# Patient Record
Sex: Male | Born: 1970 | Race: Black or African American | Hispanic: No
Health system: Southern US, Community
[De-identification: ages and names within clinical notes are randomized; demographics above are authoritative.]

---

## 2005-10-06 ENCOUNTER — Encounter (INDEPENDENT_AMBULATORY_CARE_PROVIDER_SITE_OTHER): Payer: Self-pay | Admitting: *Deleted

## 2005-10-06 ENCOUNTER — Other Ambulatory Visit: Admission: RE | Admit: 2005-10-06 | Discharge: 2005-10-06 | Payer: Self-pay | Admitting: Urology

## 2009-02-20 ENCOUNTER — Ambulatory Visit (HOSPITAL_COMMUNITY): Admission: RE | Admit: 2009-02-20 | Discharge: 2009-02-20 | Payer: Self-pay | Admitting: Family Medicine

## 2010-03-20 ENCOUNTER — Emergency Department (HOSPITAL_COMMUNITY): Payer: BC Managed Care – PPO

## 2010-03-20 ENCOUNTER — Observation Stay (HOSPITAL_COMMUNITY)
Admission: EM | Admit: 2010-03-20 | Discharge: 2010-03-21 | Disposition: A | Discharge status: Home / Self Care | Payer: BC Managed Care – PPO | Attending: Internal Medicine | Admitting: Internal Medicine

## 2010-03-20 ENCOUNTER — Other Ambulatory Visit (HOSPITAL_COMMUNITY): Payer: Self-pay | Admitting: Internal Medicine

## 2010-03-20 ENCOUNTER — Ambulatory Visit (HOSPITAL_COMMUNITY)
Admission: RE | Admit: 2010-03-20 | Discharge: 2010-03-20 | Disposition: A | Discharge status: Home / Self Care | Payer: BC Managed Care – PPO | Source: Ambulatory Visit | Attending: Internal Medicine | Admitting: Internal Medicine

## 2010-03-20 DIAGNOSIS — R05 Cough: Secondary | ICD-10-CM | POA: Insufficient documentation

## 2010-03-20 DIAGNOSIS — R630 Anorexia: Secondary | ICD-10-CM | POA: Insufficient documentation

## 2010-03-20 DIAGNOSIS — E860 Dehydration: Secondary | ICD-10-CM | POA: Insufficient documentation

## 2010-03-20 DIAGNOSIS — E119 Type 2 diabetes mellitus without complications: Secondary | ICD-10-CM | POA: Insufficient documentation

## 2010-03-20 DIAGNOSIS — R059 Cough, unspecified: Secondary | ICD-10-CM | POA: Insufficient documentation

## 2010-03-20 DIAGNOSIS — R509 Fever, unspecified: Secondary | ICD-10-CM | POA: Insufficient documentation

## 2010-03-20 DIAGNOSIS — R7402 Elevation of levels of lactic acid dehydrogenase (LDH): Secondary | ICD-10-CM | POA: Insufficient documentation

## 2010-03-20 DIAGNOSIS — R7401 Elevation of levels of liver transaminase levels: Secondary | ICD-10-CM | POA: Insufficient documentation

## 2010-03-20 DIAGNOSIS — N289 Disorder of kidney and ureter, unspecified: Secondary | ICD-10-CM | POA: Insufficient documentation

## 2010-03-20 DIAGNOSIS — R21 Rash and other nonspecific skin eruption: Secondary | ICD-10-CM | POA: Insufficient documentation

## 2010-03-20 DIAGNOSIS — R5381 Other malaise: Secondary | ICD-10-CM | POA: Insufficient documentation

## 2010-03-20 LAB — DIFFERENTIAL
Eosinophils Absolute: 0 10*3/uL (ref 0.0–0.7)
Lymphocytes Relative: 7 % — ABNORMAL LOW (ref 12–46)
Monocytes Absolute: 0.8 10*3/uL (ref 0.1–1.0)

## 2010-03-20 LAB — LACTIC ACID, PLASMA: Lactic Acid, Venous: 2.2 mmol/L (ref 0.5–2.2)

## 2010-03-20 LAB — URINALYSIS, ROUTINE W REFLEX MICROSCOPIC
Ketones, ur: 40 mg/dL — AB
Protein, ur: 100 mg/dL — AB
Specific Gravity, Urine: 1.025 (ref 1.005–1.030)
Urobilinogen, UA: >8 mg/dL — ABNORMAL HIGH (ref 0.0–1.0)

## 2010-03-20 LAB — COMPREHENSIVE METABOLIC PANEL
AST: 49 U/L — ABNORMAL HIGH (ref 0–37)
Albumin: 3.5 g/dL (ref 3.5–5.2)
Chloride: 96 mEq/L (ref 96–112)
GFR calc Af Amer: >60 mL/min (ref >60)
Total Bilirubin: 1.8 mg/dL — ABNORMAL HIGH (ref 0.3–1.2)
Total Protein: 7.4 g/dL (ref 6.0–8.3)

## 2010-03-20 LAB — CBC
HCT: 40.3 % (ref 39.0–52.0)
MCH: 29.5 pg (ref 26.0–34.0)
MCHC: 34 g/dL (ref 30.0–36.0)
MCV: 86.7 fL (ref 78.0–100.0)
Platelets: 302 10*3/uL (ref 150–400)
RBC: 4.65 MIL/uL (ref 4.22–5.81)
RDW: 12.5 % (ref 11.5–15.5)

## 2010-03-20 LAB — URINE MICROSCOPIC-ADD ON

## 2010-03-20 LAB — RAPID URINE DRUG SCREEN, HOSP PERFORMED
Amphetamines: NOT DETECTED
Barbiturates: NOT DETECTED
Benzodiazepines: NOT DETECTED
Tetrahydrocannabinol: NOT DETECTED

## 2010-03-20 LAB — TROPONIN I: Troponin I: 0.03 ng/mL (ref 0.00–0.06)

## 2010-03-20 LAB — GLUCOSE, CAPILLARY: Glucose-Capillary: 116 mg/dL — ABNORMAL HIGH (ref 70–99)

## 2010-03-20 LAB — CK TOTAL AND CKMB (NOT AT ARMC): Relative Index: INVALID (ref 0.0–2.5)

## 2010-03-20 LAB — PROCALCITONIN: Procalcitonin: 0.2 ng/mL

## 2010-03-21 LAB — DIFFERENTIAL
Basophils Absolute: 0.1 K/uL (ref 0.0–0.1)
Basophils Relative: 1 % (ref 0–1)
Eosinophils Absolute: 0.2 K/uL (ref 0.0–0.7)
Eosinophils Relative: 2 % (ref 0–5)
Lymphocytes Relative: 9 % — ABNORMAL LOW (ref 12–46)
Lymphs Abs: 0.9 K/uL (ref 0.7–4.0)
Monocytes Absolute: 0.9 K/uL (ref 0.1–1.0)
Monocytes Relative: 9 % (ref 3–12)
Neutro Abs: 7.7 K/uL (ref 1.7–7.7)
Neutrophils Relative %: 79 % — ABNORMAL HIGH (ref 43–77)

## 2010-03-21 LAB — HEMOGLOBIN A1C
Hgb A1c MFr Bld: 5.3 %
Mean Plasma Glucose: 105 mg/dL

## 2010-03-21 LAB — CBC
HCT: 37.1 % — ABNORMAL LOW (ref 39.0–52.0)
Hemoglobin: 12.5 g/dL — ABNORMAL LOW (ref 13.0–17.0)
MCH: 29.3 pg (ref 26.0–34.0)
MCHC: 33.7 g/dL (ref 30.0–36.0)
MCV: 87.1 fL (ref 78.0–100.0)
Platelets: 289 K/uL (ref 150–400)
RBC: 4.26 MIL/uL (ref 4.22–5.81)
RDW: 12.6 % (ref 11.5–15.5)
WBC: 9.7 K/uL (ref 4.0–10.5)

## 2010-03-21 LAB — COMPREHENSIVE METABOLIC PANEL WITH GFR
ALT: 52 U/L (ref 0–53)
AST: 31 U/L (ref 0–37)
Albumin: 2.9 g/dL — ABNORMAL LOW (ref 3.5–5.2)
Alkaline Phosphatase: 85 U/L (ref 39–117)
BUN: 12 mg/dL (ref 6–23)
CO2: 26 meq/L (ref 19–32)
Calcium: 8.5 mg/dL (ref 8.4–10.5)
Chloride: 100 meq/L (ref 96–112)
Creatinine, Ser: 1.2 mg/dL (ref 0.4–1.5)
GFR calc non Af Amer: >60 mL/min
Glucose, Bld: 86 mg/dL (ref 70–99)
Potassium: 4 meq/L (ref 3.5–5.1)
Sodium: 136 meq/L (ref 135–145)
Total Bilirubin: 1.4 mg/dL — ABNORMAL HIGH (ref 0.3–1.2)
Total Protein: 6.2 g/dL (ref 6.0–8.3)

## 2010-03-21 LAB — HEPATITIS PANEL, ACUTE
HCV Ab: NEGATIVE
Hepatitis B Surface Ag: NEGATIVE

## 2010-03-21 LAB — HIV ANTIBODY (ROUTINE TESTING W REFLEX): HIV: NONREACTIVE

## 2010-03-21 LAB — GLUCOSE, CAPILLARY
Glucose-Capillary: 101 mg/dL — ABNORMAL HIGH (ref 70–99)
Glucose-Capillary: 132 mg/dL — ABNORMAL HIGH (ref 70–99)

## 2010-03-21 LAB — URINE CULTURE

## 2010-03-21 LAB — TSH: TSH: 1.546 u[IU]/mL (ref 0.350–4.500)

## 2010-03-25 LAB — CULTURE, BLOOD (ROUTINE X 2)

## 2010-03-28 NOTE — Discharge Summary (Signed)
NAMEOSCEOLA, DEPAZ          ACCOUNT NO.:  [id number]  MEDICAL RECORD NO.:  [id number]           PATIENT TYPE:  I  LOCATION:  A304                          FACILITY:  [facility]  PHYSICIAN:  Brogan England L. Lendell Caprice, MDDATE OF BIRTH:  [birth year]  DATE OF ADMISSION:  03/20/2010 DATE OF DISCHARGE:  03/01/2012LH                              DISCHARGE SUMMARY   DISCHARGE DIAGNOSES: 1. Fever:  Suspect viral illness but cannot rule out urinary tract     infection. 2. Recently diagnosed diabetes, euglycemic off medications currently,     and hemoglobin A1c normal. 3. Slightly elevated liver function tests, improved. 4. Dehydration. 5. Acute renal insufficiency secondary to above.  DISCHARGE MEDICATIONS: 1. Ciprofloxacin 500 mg p.o. b.i.d. to complete a 7-day course. 2. Tylenol 650 mg p.o. q.4 h. p.r.n. pain or fever. 3. Ibuprofen 400 mg p.o. q.4 h. p.r.n. pain or fever. 4. Stop metformin but resume if fasting blood glucose above 150     consistently.  CONDITION:  Stable.  ACTIVITY:  No heavy exertion or lifting until symptoms are resolved.  He may return to work next week.  Follow up with Dr. Sherwood Gambler in 2 weeks.  Please monitor blood glucose and need for continued diabetic diet and metformin.  DIET:  Should be diabetic with plenty of fluids.  CONSULTATIONS:  None.  PROCEDURES:  None.  LABORATORY DATA:  CBC on admission significant for white blood cell count of 12,500.  At discharge, his white count was 9700.  The rest of his CBC was unremarkable.  Complete metabolic panel on admission significant for a normal glucose, BUN was 15, creatinine 1.55.  Total bilirubin 1.8, normal alkaline phosphatase, SGOT 49, SGPT 69.  At discharge, his creatinine is 1.2.  His liver function tests were normal except for slightly elevated total bilirubin of 1.4.  TSH normal. Hepatitis B surface antigen negative, hepatitis C antibody negative. HIV screen nonreactive.  TSH normal.  CPK normal.  Urine  drug screen negative.  Urinalysis showed 100 glucose, moderate bilirubin, 40 ketones, trace protein, greater than 8 urobilinogen, positive nitrite, negative leukocyte esterase, 7-10 white cells, 0-2 red cells, few bacteria.  Gram stain negative.  Mono screen negative.  Urine culture pending.  Blood cultures to date are negative.  DIAGNOSTICS:  Chest x-ray negative.  HISTORY AND HOSPITAL COURSE:  Please see H and P for details.  Mr. Jacob Mcneil is a 40 year old black male who has had fevers and chills as well as generalized malaise, weakness, and anorexia for the past week or so.  He went to the office, got some blood work done about a week ago and was called in a prescription for metformin.  He has no previous history of diabetes.  He came back to his primary care physician's office and reportedly had a fever of 103.7.  He reportedly has had fevers up to 105 at home.  He developed a rash on his legs in the emergency room which was nonpruritic, it was macular and erythematous. He had no rash anywhere else.  He had no recent sick contacts, no travel.  He had no other symptoms.  He had no dysuria, specifically no flank  pain or suprapubic pain.  In the emergency room after having had nonsteroidal anti-inflammatories, he was afebrile.  He was a bit dehydrated and weak and so he was observed overnight and given IV fluids.  Despite the paucity of UTI symptoms, he was started empirically on Cipro, but I suspect this may all be viral.  His liver function tests were slightly elevated.  He had no abdominal pain or tenderness, and the hepatitis screen to date is negative.  His liver function tests are improving, and I suspect this was just a result of the acute illness. He does not check his sugars at home.  He does not have a glucometer. His blood sugars here off metformin have been around 100.  His hemoglobin A1c was only 5.7.  I suspect his hyperglycemia in the office may have been due to stress  response from acute illness.  I have asked him to stop his metformin but continue with diabetic diet and this will have to be managed as an outpatient.  I also asked the nurses to teach him glucometer use and given him a prescription for glucometer to check his sugars daily.  He is to bring the values with him to the followup appointment.  His rash improved.  He was feeling much stronger.  He was tolerating a diet, and he is safe to go home and can have Cipro and ibuprofen.  His blood pressure, and vital signs have, otherwise, been stable.     Kamari Buch L. Lendell Caprice, MD     CLS/MEDQ  D:  03/21/2010  T:  03/22/2010  Job:  [id number]  cc:   Madelin Rear. Sherwood Gambler, MD Fax: [phone]  Electronically Signed by Crista Curb MD on 03/27/2010 07:48:18 PM

## 2010-03-28 NOTE — H&P (Addendum)
NAMEHELMUTH, RECUPERO          ACCOUNT NO.:  [id number]  MEDICAL RECORD NO.:  [id number]  LOCATION:  RDC                           FACILITY:  [facility]  PHYSICIAN:  Sharlon Pfohl L. Lendell Caprice, MDDATE OF BIRTH:  12-05-1970  DATE OF ADMISSION:  03/20/2010 DATE OF DISCHARGE:  02/29/2012LH                             HISTORY & PHYSICAL   CHIEF COMPLAINT:  Fever.  HISTORY OF PRESENT ILLNESS:  Mr. Jacob Mcneil is a 40 year old black male who has had fevers and chills for the past week.  He apparently went to the office and some blood work was drawn.  He was called back and told that he is diabetic and called in a prescription for metformin.  He has had periodic fevers, chills, malaise.  He stayed in bed all day yesterday.  He has maybe a mild amount of frontal sinus pressure and pain.  He has no rhinorrhea, no postnasal drip, no sore throat.  He developed nonpruritic rash on his legs in the emergency room today.  He has no dysuria.  His appetite has been poor.  He has had no nausea, vomiting, or diarrhea.  He has had no recent travel or sick contacts. He reportedly had a temperature of 105 at home.  His temperature was reportedly 103.7 in the office, and he took Advil.  He has been afebrile in the emergency room.  He was found to have some pyuria and mildly increased LFTs.  He has had a new sexual contact, but has no penile drainage or ulcerations.  He has no history of liver problems.  He drinks about once a week on the weekends, and denies drinking too excess.  He has no history of IV drug abuse or blood transfusions.  He has no other symptoms.  He was given Zosyn in the emergency room, but his ex-wife reports that his rash was present prior to the antibiotics.  PAST MEDICAL HISTORY:  As above.  MEDICATIONS:  Metformin, which is new 500 mg twice a day.  No known drug allergies.  SOCIAL HISTORY:  The patient drinks occasionally.  He does not smoke or use drugs.  He works at [employer].  FAMILY HISTORY:  Negative for cancer.  His mother is here and is healthy.  PAST SURGICAL HISTORY:  None.  Systems reviewed and as above otherwise negative.  PHYSICAL EXAMINATION:  VITAL SIGNS:  His temperature is 98.9; blood pressure 115/69; heart rate initially 111, currently 87; respiratory rate 17; oxygen saturation 97% on room air. GENERAL:  The patient appears nontoxic.  He is comfortable.  He has received 1 mg of Ativan and is somewhat slow to respond. HEENT:  No sinus tenderness.  Nares are patent without drainage. Oropharynx has moist mucous membranes.  No tonsillar exudate or erythema. NECK:  Supple.  No lymphadenopathy. LUNGS:  Clear to auscultation bilaterally without wheezes, rhonchi, or rales. CARDIOVASCULAR:  Regular rate and rhythm without murmurs, gallops, or rubs. ABDOMEN:  Normal bowel sounds, soft, nontender, nondistended. GU AND RECTAL:  Deferred. EXTREMITIES:  No clubbing, cyanosis, or edema. SKIN:  He has erythematous macular marks on his legs.  There is nothing on the soles of his feet or his palms or trunk or anywhere  else. NEUROLOGIC:  The patient is alert, oriented x3.  Cranial nerves and sensorimotor exam are intact. PSYCHIATRIC:  Flat affect.  His ex-wife does most of the question answering.  LABORATORY DATA:  CBC significant for white blood cell count of 12,500, 87% neutrophils.  Sodium is 134, potassium 4.3, chloride 96, bicarbonate 27, glucose 105, BUN 15, creatinine 1.55, total bilirubin 1.8, alkaline phosphatase 109, SGOT 49, SGPT 69.  Urinalysis shows a specific gravity of 1.25, 100 glucose, pH 6, moderate bilirubin, 40 ketones, trace blood, 100 protein, greater than 8 urobilinogen, nitrite positive, leukocyte esterase negative, 7-10 white cells, 0-2 red cells, few bacteria.  Chest x-ray shows nothing acute.  ASSESSMENT AND PLAN: 1. Febrile illness with rash and pyuria:  The patient does not appear     toxic.  He is agreeable to  staying overnight for monitoring.  He     has no dysuria, but I will go ahead and treat with ciprofloxacin     for urinary tract infection.  I will also check a total CPK, TSH,     HIV, hepatitis panel due to the increased LFTs.  With the rash and     the high fevers without much in the way of other symptoms other     than malaise, this may just be a viral illness, and he may be able     to go home tomorrow if he is stable.  He has not been eating much,     and his creatinine is slightly elevated.  I will give him IV fluids     overnight and supportive care. 2. Mildly increased LFTs without any abdominal pain or tenderness.     See above.  I will repeat his LFTs tomorrow. 3. His ex-wife is requesting Ativan and a sleep aid as needed.  He     reports that he is slightly anxious.  I will get a urine drug     screen. 4. Reported recently diagnosed diabetes with euglycemia here.  I     wonder whether his hyperglycemia as an outpatient may have been     related to his acute illness.  I will get records from the office     and hold his metformin and check blood glucose for now.  Also check     a hemoglobin A1c. 5. Renal insufficiency may be somewhat related to his dehydration.     Steve Youngberg L. Lendell Caprice, MD     CLS/MEDQ  D:  03/21/2010  T:  03/22/2010  Job:  [id number]  Electronically Signed by Crista Curb MD on 07/02/2010 11:12:25 AM

## 2012-04-22 IMAGING — CR DG CHEST 1V PORT
1 series · 1 of 1 positions shown · non-contrast
Comparison: Portable exam 6426 hours compared to 03/20/2010 at 2244
hours.

CLINICAL DATA: Fever, chills, dysuria, recent diagnosis of diabetes

PORTABLE CHEST - 1 VIEW

[view not recorded]
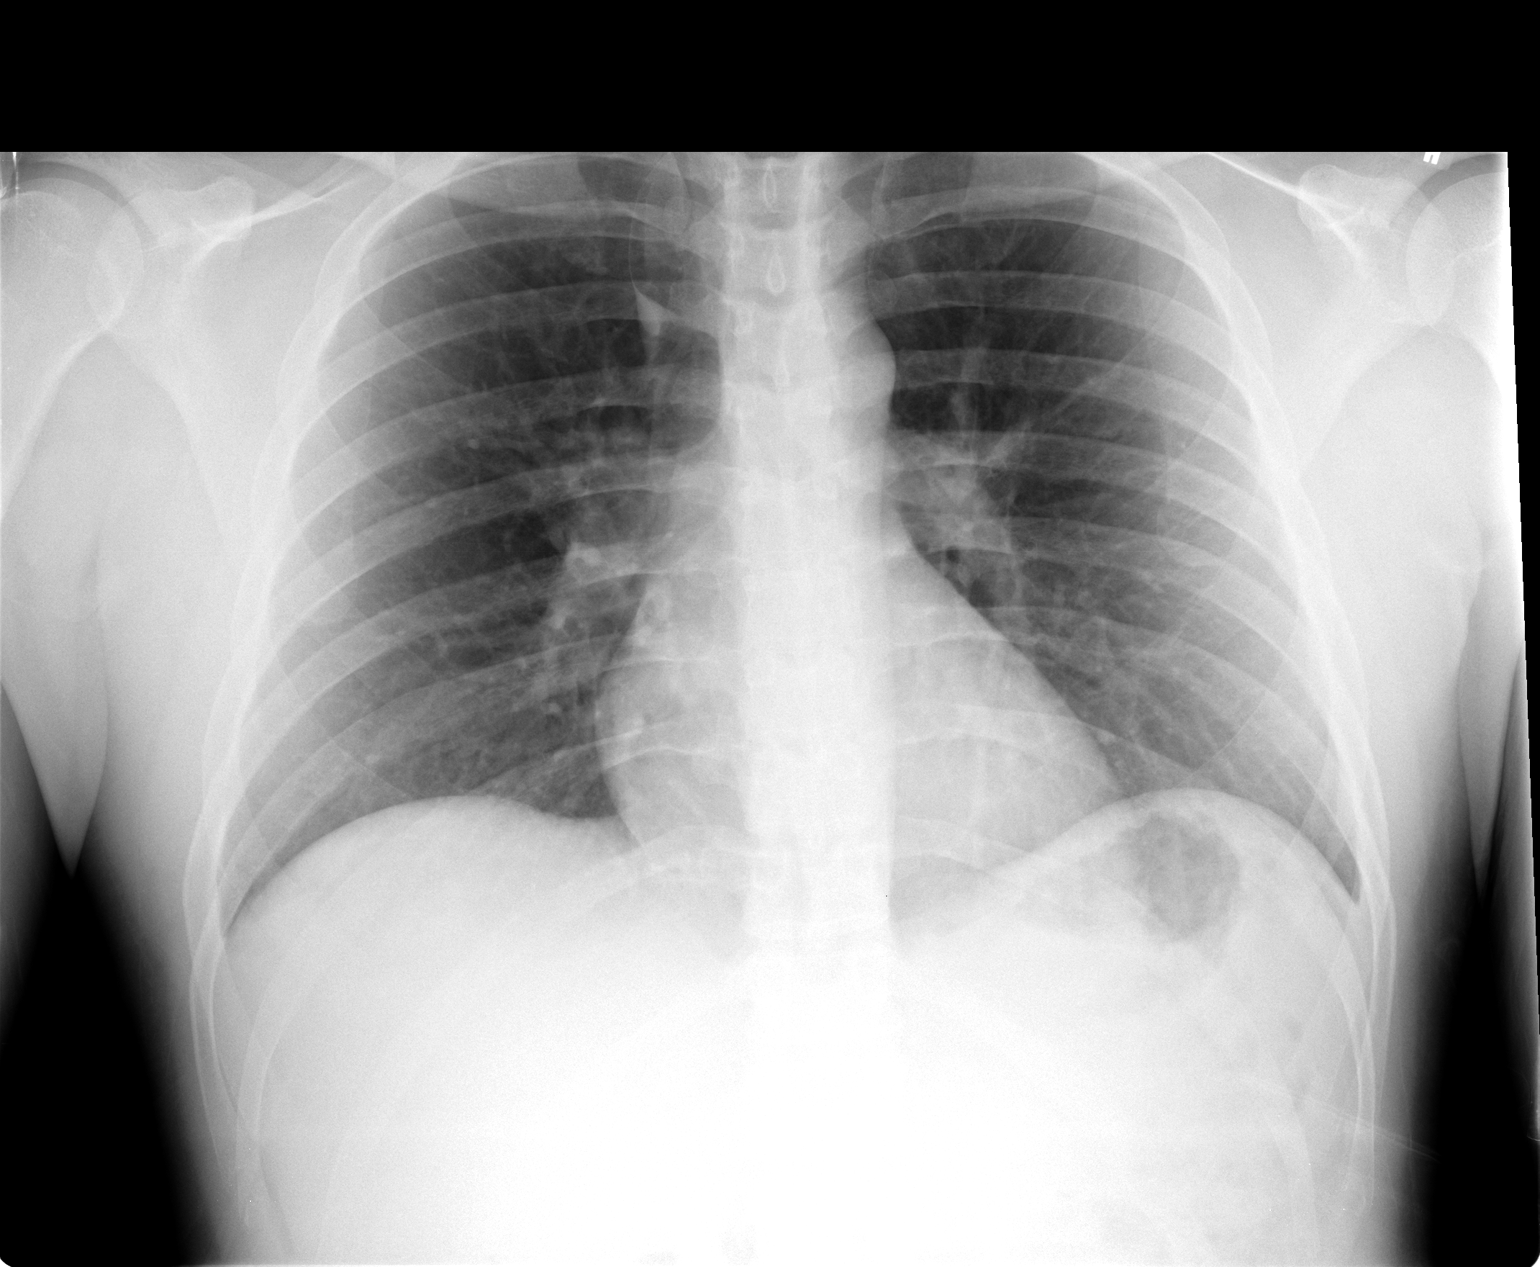

[1 of 1 positions shown; findings below may reference images not displayed]

FINDINGS: Azygos fissure identified.
Normal heart size, mediastinal contours, and pulmonary vascularity.
Lungs clear.
Bones unremarkable.
No pneumothorax.
IMPRESSION: No acute abnormalities.

## 2016-10-01 DIAGNOSIS — Z6826 Body mass index (BMI) 26.0-26.9, adult: Secondary | ICD-10-CM | POA: Diagnosis not present

## 2016-10-01 DIAGNOSIS — Z0000 Encounter for general adult medical examination without abnormal findings: Secondary | ICD-10-CM | POA: Diagnosis not present

## 2016-10-01 DIAGNOSIS — Z1389 Encounter for screening for other disorder: Secondary | ICD-10-CM | POA: Diagnosis not present

## 2016-10-01 DIAGNOSIS — E663 Overweight: Secondary | ICD-10-CM | POA: Diagnosis not present

## 2017-10-13 DIAGNOSIS — R000 Tachycardia, unspecified: Secondary | ICD-10-CM | POA: Diagnosis not present

## 2017-10-13 DIAGNOSIS — Z6828 Body mass index (BMI) 28.0-28.9, adult: Secondary | ICD-10-CM | POA: Diagnosis not present

## 2017-10-13 DIAGNOSIS — Z0000 Encounter for general adult medical examination without abnormal findings: Secondary | ICD-10-CM | POA: Diagnosis not present

## 2017-10-13 DIAGNOSIS — E663 Overweight: Secondary | ICD-10-CM | POA: Diagnosis not present

## 2017-10-13 DIAGNOSIS — R7309 Other abnormal glucose: Secondary | ICD-10-CM | POA: Diagnosis not present

## 2017-10-13 DIAGNOSIS — Z1389 Encounter for screening for other disorder: Secondary | ICD-10-CM | POA: Diagnosis not present

## 2017-10-13 DIAGNOSIS — Z0001 Encounter for general adult medical examination with abnormal findings: Secondary | ICD-10-CM | POA: Diagnosis not present

## 2017-10-28 ENCOUNTER — Encounter: Payer: Self-pay | Admitting: Cardiology

## 2020-10-17 DIAGNOSIS — Z0000 Encounter for general adult medical examination without abnormal findings: Secondary | ICD-10-CM | POA: Diagnosis not present

## 2020-10-17 DIAGNOSIS — Z6826 Body mass index (BMI) 26.0-26.9, adult: Secondary | ICD-10-CM | POA: Diagnosis not present

## 2020-10-17 DIAGNOSIS — R509 Fever, unspecified: Secondary | ICD-10-CM | POA: Diagnosis not present

## 2021-07-02 ENCOUNTER — Encounter (HOSPITAL_COMMUNITY): Payer: Self-pay

## 2021-07-02 ENCOUNTER — Emergency Department (HOSPITAL_COMMUNITY)
Admission: EM | Admit: 2021-07-02 | Discharge: 2021-07-02 | Disposition: A | Discharge status: Home / Self Care | Payer: BC Managed Care – PPO | Attending: Emergency Medicine | Admitting: Emergency Medicine

## 2021-07-02 ENCOUNTER — Other Ambulatory Visit: Payer: Self-pay

## 2021-07-02 DIAGNOSIS — R0981 Nasal congestion: Secondary | ICD-10-CM | POA: Insufficient documentation

## 2021-07-02 MED ORDER — FLUTICASONE PROPIONATE 50 MCG/ACT NA SUSP: 1.0000 | Freq: Every day | NASAL | 0 refills | Status: AC

## 2021-07-02 MED ORDER — PREDNISONE 20 MG PO TABS
40.0000 mg | ORAL_TABLET | Freq: Once | ORAL | Status: AC
  Administered 2021-07-02: 40 mg via ORAL
  Filled 2021-07-02: qty 2

## 2021-07-02 MED ORDER — PREDNISONE 10 MG PO TABS: 40.0000 mg | ORAL_TABLET | Freq: Every day | ORAL | 0 refills | Status: AC

## 2021-07-02 NOTE — Discharge Instructions (Addendum)
You were seen in the emergency department today for significant nasal congestion.  Stop using Afrin.  Please start using Flonase 1 spray per nostril daily.  Please also take prednisone for the next 4 days, we gave your first dose in the emergency department this morning, you can start this medicine tomorrow.  We have prescribed you new medication(s) today. Discuss the medications prescribed today with your pharmacist as they can have adverse effects and interactions with your other medicines including over the counter and prescribed medications. Seek medical evaluation if you start to experience new or abnormal symptoms after taking one of these medicines, seek care immediately if you start to experience difficulty breathing, feeling of your throat closing, facial swelling, or rash as these could be indications of a more serious allergic reaction   Please follow-up with primary care within 3 days for a recheck of your symptoms as well as a recheck of your blood pressure as it was elevated in the ER this morning.  We have also provided information for ear nose and throat follow-up.  Return to the emergency department for any new or worsening symptoms or any other concerns.

## 2021-07-02 NOTE — ED Provider Notes (Signed)
[facility] EMERGENCY DEPARTMENT Provider Note   CSN: [id number] Arrival date & time: 07/02/21  0433     History  Chief Complaint  Patient presents with   Nasal Congestion    Jacob Mcneil is a 51 y.o. male without significant pmhx who presents to the ED with complaints of nasal congestion x 3 weeks. Patient reports congestion with sinus pressure, sneezing, and rhinorrhea. Utilizing afrin daily and taking Claritin without relief. Sent in augmentin by PCP which he completed without improvement. Utilized netty pot today and now feels like he cant breathe through his nose. Denies fever, chills, sore throat, cough, or dyspnea.    HPI     Home Medications Prior to Admission medications   Not on File      Allergies    Patient has no allergy information on record.    Review of Systems   Review of Systems  Constitutional:  Negative for chills and fever.  HENT:  Positive for congestion and sinus pressure. Negative for sore throat.   Respiratory:  Negative for cough and shortness of breath.   Cardiovascular:  Negative for chest pain.  Gastrointestinal:  Negative for abdominal pain and vomiting.  Neurological:  Negative for syncope.  All other systems reviewed and are negative.   Physical Exam Updated Vital Signs BP (!) 169/111 (BP Location: Right Arm)   Pulse 70   Temp 98.6 F (37 C)   Resp 16   SpO2 99%  Physical Exam Vitals and nursing note reviewed.  Constitutional:      General: He is not in acute distress.    Appearance: He is well-developed. He is not toxic-appearing.  HENT:     Head: Normocephalic and atraumatic.     Right Ear: Ear canal normal. Tympanic membrane is not perforated, erythematous, retracted or bulging.     Left Ear: Ear canal normal. Tympanic membrane is not perforated, erythematous, retracted or bulging.     Ears:     Comments: No mastoid erythema/swellng/tenderness.     Nose: Mucosal edema and rhinorrhea present.      Right Turbinates: Swollen.     Left Turbinates: Swollen.     Right Sinus: No frontal sinus tenderness.     Left Sinus: No frontal sinus tenderness.     Mouth/Throat:     Pharynx: Oropharynx is clear. Uvula midline. No oropharyngeal exudate or posterior oropharyngeal erythema.     Comments: Posterior oropharynx is symmetric appearing. Patient tolerating own secretions without difficulty. No trismus. No drooling. No hot potato voice. No swelling beneath the tongue, submandibular compartment is soft.  Eyes:     General:        Right eye: No discharge.        Left eye: No discharge.     Conjunctiva/sclera: Conjunctivae normal.  Cardiovascular:     Rate and Rhythm: Normal rate and regular rhythm.  Pulmonary:     Effort: Pulmonary effort is normal. No respiratory distress.     Breath sounds: Normal breath sounds. No wheezing, rhonchi or rales.  Abdominal:     General: There is no distension.     Palpations: Abdomen is soft.     Tenderness: There is no abdominal tenderness.  Musculoskeletal:     Cervical back: Neck supple. No rigidity.  Lymphadenopathy:     Cervical: No cervical adenopathy.  Skin:    General: Skin is warm and dry.     Findings: No rash.  Neurological:     Mental Status:  He is alert.  Psychiatric:        Behavior: Behavior normal.     ED Results / Procedures / Treatments   Labs (all labs ordered are listed, but only abnormal results are displayed) Labs Reviewed - No data to display  EKG None  Radiology No results found.  Procedures Procedures    Medications Ordered in ED Medications  predniSONE (DELTASONE) tablet 40 mg (has no administration in time range)    ED Course/ Medical Decision Making/ A&P                           Medical Decision Making Risk Prescription drug management.  Patient presents to the ED with complaints of nasal congestion/pressure.  Nontoxic, vitals w/ elevated BP- doubt HTN emergency.   Given afebrile, S/p augmentin  without improvement feel that bacterial sinusitis is less likely.  Daily afrin use for 3 weeks, concerning for possible rhinitis medicamentosa vs. Viral process vs. Allergies. Will give intranasal and oral steroids with discontinuation of afrin. PCP/ENT follow up. I discussed  treatment plan, need for follow-up, and return precautions with the patient. Provided opportunity for questions, patient confirmed understanding and is in agreement with plan.    Final Clinical Impression(s) / ED Diagnoses Final diagnoses:  Nasal congestion    Rx / DC Orders ED Discharge Orders          Ordered    fluticasone (FLONASE) 50 MCG/ACT nasal spray  Daily        07/02/21 0502    predniSONE (DELTASONE) 10 MG tablet  Daily        07/02/21 0502              Cherly Anderson, PA-C 07/02/21 0525    Nira Conn, MD 07/02/21 [phone]

## 2021-11-28 DIAGNOSIS — Z6826 Body mass index (BMI) 26.0-26.9, adult: Secondary | ICD-10-CM | POA: Diagnosis not present

## 2021-11-28 DIAGNOSIS — E663 Overweight: Secondary | ICD-10-CM | POA: Diagnosis not present

## 2021-11-28 DIAGNOSIS — Z1331 Encounter for screening for depression: Secondary | ICD-10-CM | POA: Diagnosis not present

## 2021-11-28 DIAGNOSIS — Z0000 Encounter for general adult medical examination without abnormal findings: Secondary | ICD-10-CM | POA: Diagnosis not present

## 2021-11-28 DIAGNOSIS — A084 Viral intestinal infection, unspecified: Secondary | ICD-10-CM | POA: Diagnosis not present

## 2023-03-19 DIAGNOSIS — Z0001 Encounter for general adult medical examination with abnormal findings: Secondary | ICD-10-CM | POA: Diagnosis not present

## 2023-03-19 DIAGNOSIS — Z1331 Encounter for screening for depression: Secondary | ICD-10-CM | POA: Diagnosis not present

## 2023-03-19 DIAGNOSIS — E663 Overweight: Secondary | ICD-10-CM | POA: Diagnosis not present

## 2023-03-19 DIAGNOSIS — Z6827 Body mass index (BMI) 27.0-27.9, adult: Secondary | ICD-10-CM | POA: Diagnosis not present

## 2023-04-24 DIAGNOSIS — E663 Overweight: Secondary | ICD-10-CM | POA: Diagnosis not present

## 2023-04-24 DIAGNOSIS — M5431 Sciatica, right side: Secondary | ICD-10-CM | POA: Diagnosis not present

## 2023-04-24 DIAGNOSIS — Z6827 Body mass index (BMI) 27.0-27.9, adult: Secondary | ICD-10-CM | POA: Diagnosis not present

## 2023-09-30 DIAGNOSIS — M5441 Lumbago with sciatica, right side: Secondary | ICD-10-CM | POA: Diagnosis not present

## 2023-09-30 DIAGNOSIS — M9902 Segmental and somatic dysfunction of thoracic region: Secondary | ICD-10-CM | POA: Diagnosis not present

## 2023-09-30 DIAGNOSIS — M9905 Segmental and somatic dysfunction of pelvic region: Secondary | ICD-10-CM | POA: Diagnosis not present

## 2023-09-30 DIAGNOSIS — M9903 Segmental and somatic dysfunction of lumbar region: Secondary | ICD-10-CM | POA: Diagnosis not present

## 2023-10-28 DIAGNOSIS — M5416 Radiculopathy, lumbar region: Secondary | ICD-10-CM | POA: Diagnosis not present

## 2023-11-10 DIAGNOSIS — Z135 Encounter for screening for eye and ear disorders: Secondary | ICD-10-CM | POA: Diagnosis not present

## 2023-11-11 DIAGNOSIS — M5416 Radiculopathy, lumbar region: Secondary | ICD-10-CM | POA: Diagnosis not present

## 2023-11-12 DIAGNOSIS — M5416 Radiculopathy, lumbar region: Secondary | ICD-10-CM | POA: Diagnosis not present

## 2023-11-12 DIAGNOSIS — Z6826 Body mass index (BMI) 26.0-26.9, adult: Secondary | ICD-10-CM | POA: Diagnosis not present

## 2023-11-23 DIAGNOSIS — M5416 Radiculopathy, lumbar region: Secondary | ICD-10-CM | POA: Diagnosis not present

## 2023-12-15 DIAGNOSIS — M5416 Radiculopathy, lumbar region: Secondary | ICD-10-CM | POA: Diagnosis not present

## 2023-12-15 DIAGNOSIS — Z6826 Body mass index (BMI) 26.0-26.9, adult: Secondary | ICD-10-CM | POA: Diagnosis not present
